# Patient Record
Sex: Female | Born: 1965 | Race: White | Hispanic: No | Marital: Married | Smoking: Never smoker
Health system: Southern US, Community
[De-identification: ages and names within clinical notes are randomized; demographics above are authoritative.]

## PROBLEM LIST (undated history)

## (undated) HISTORY — PX: TUBAL LIGATION: SHX77

---

## 2004-05-31 ENCOUNTER — Emergency Department (HOSPITAL_COMMUNITY): Admission: EM | Admit: 2004-05-31 | Discharge: 2004-06-01 | Payer: Self-pay

## 2004-06-12 ENCOUNTER — Ambulatory Visit (HOSPITAL_COMMUNITY): Admission: RE | Admit: 2004-06-12 | Discharge: 2004-06-12 | Payer: Self-pay

## 2005-04-01 ENCOUNTER — Other Ambulatory Visit: Admission: RE | Admit: 2005-04-01 | Discharge: 2005-04-01 | Payer: Self-pay | Admitting: Obstetrics and Gynecology

## 2006-03-26 ENCOUNTER — Other Ambulatory Visit: Admission: RE | Admit: 2006-03-26 | Discharge: 2006-03-26 | Payer: Self-pay | Admitting: Obstetrics & Gynecology

## 2006-03-31 ENCOUNTER — Ambulatory Visit (HOSPITAL_COMMUNITY): Admission: RE | Admit: 2006-03-31 | Discharge: 2006-03-31 | Payer: Self-pay | Admitting: Obstetrics and Gynecology

## 2007-01-31 IMAGING — US US TRANSVAGINAL NON-OB
1 series · 18 of 25 positions shown · non-contrast
Comparison: none

CLINICAL DATA: Family history of ovarian cancer.
 TRANSABDOMINAL AND TRANSVAGINAL PELVIC ULTRASOUND ? 03/31/06:
TECHNIQUE: Both transabdominal and transvaginal ultrasound examinations of the pelvis were performed including evaluation of the uterus, ovaries, adnexal regions, and pelvic cul-de-sac.

[Series 1: us pelvis complete modify · 18 of 41 slices shown]
[im 1/41]
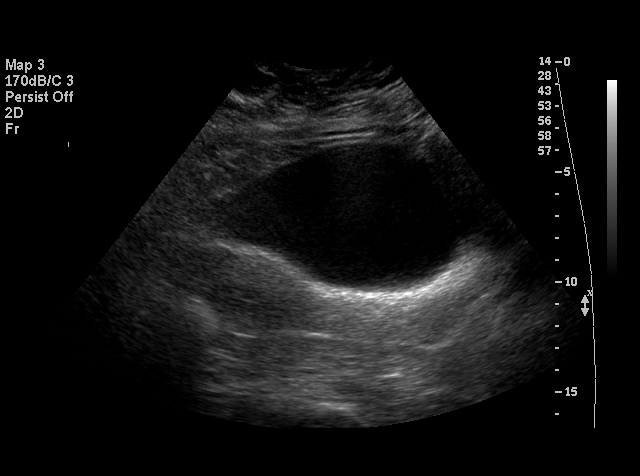
[im 4/41]
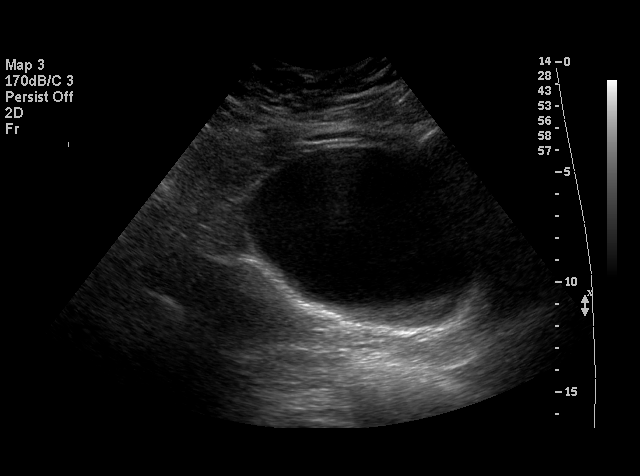
[im 6/41]
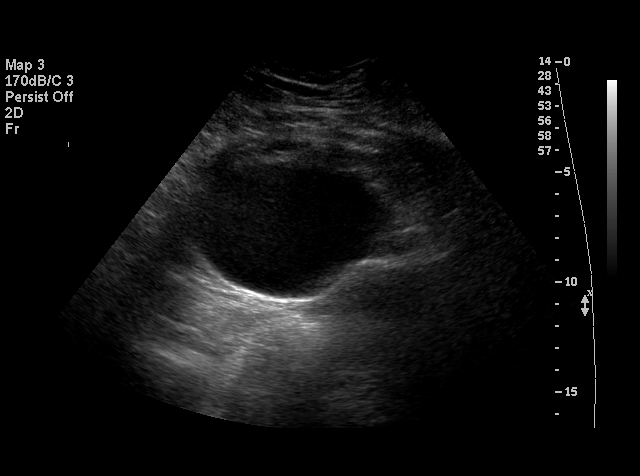
[im 7/41]
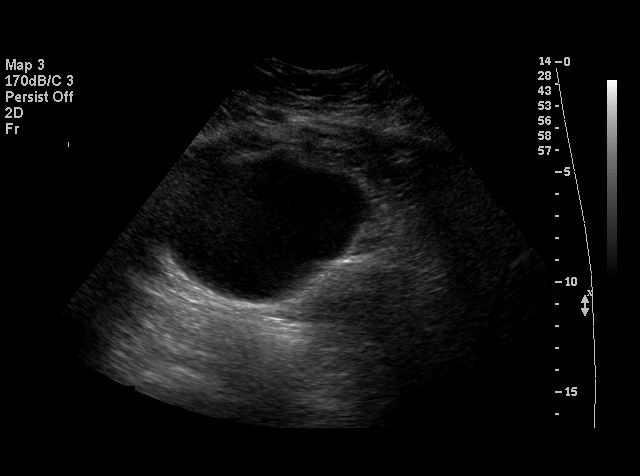
[im 11/41]
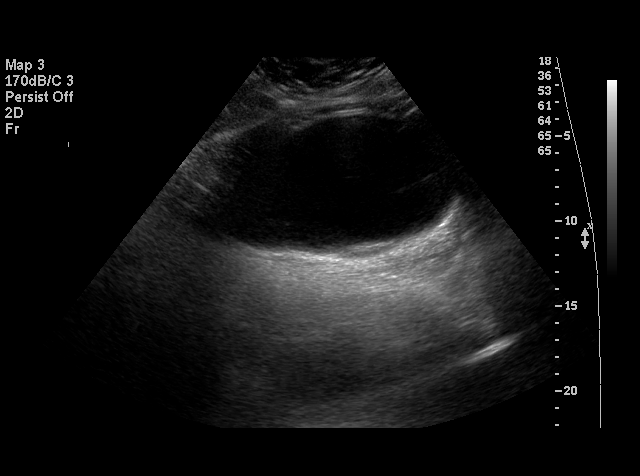
[im 12/41]
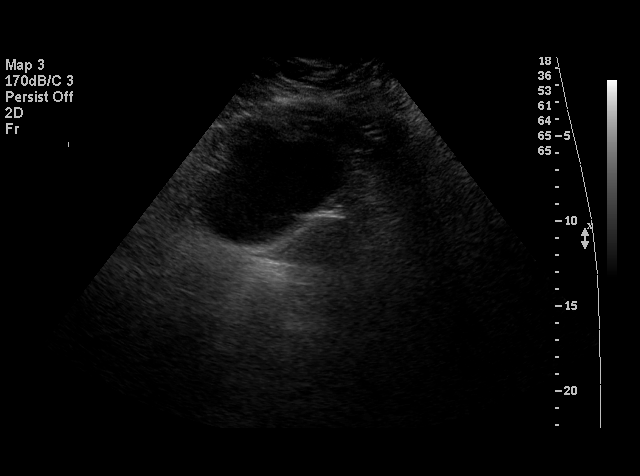
[im 16/41]
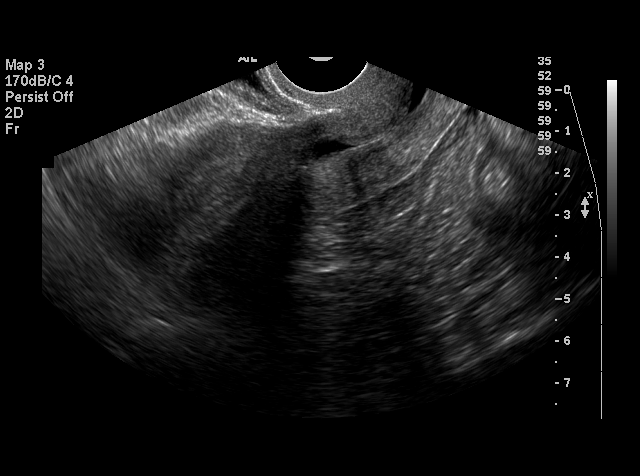
[im 17/41]
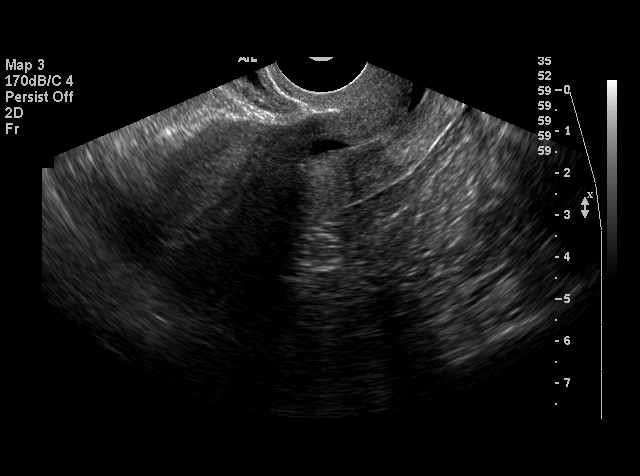
[im 19/41]
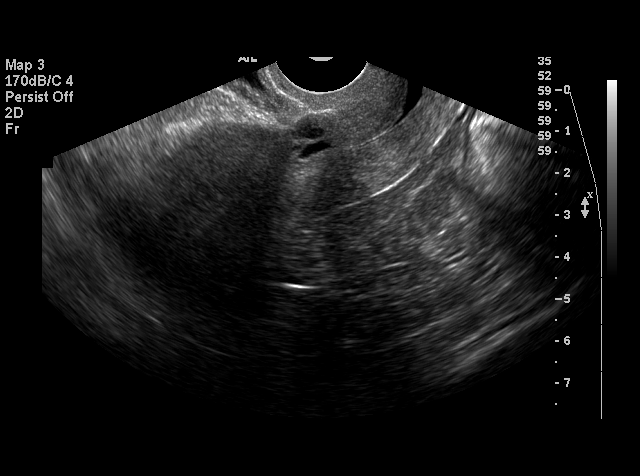
[im 22/41]
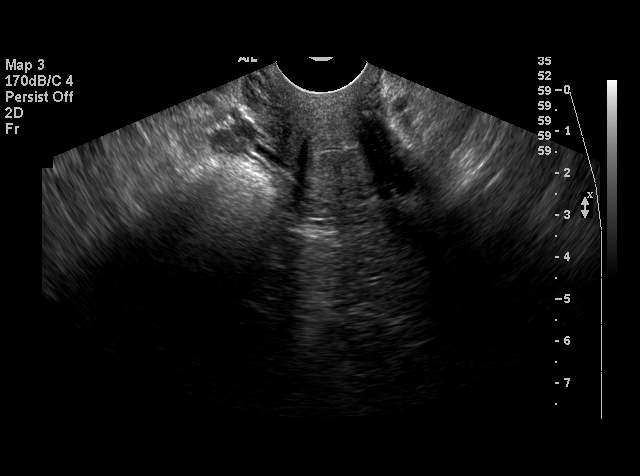
[im 24/41]
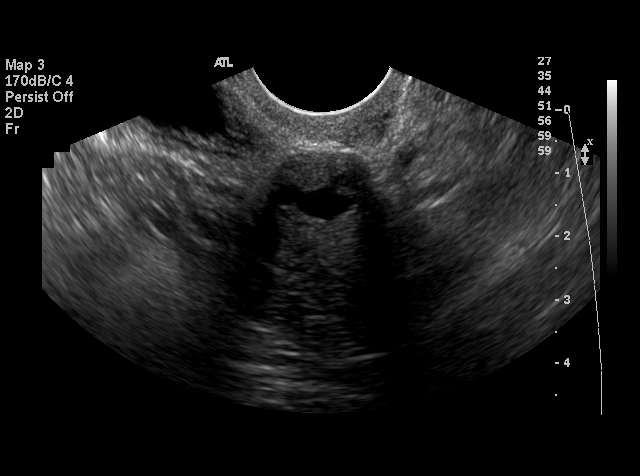
[im 26/41]
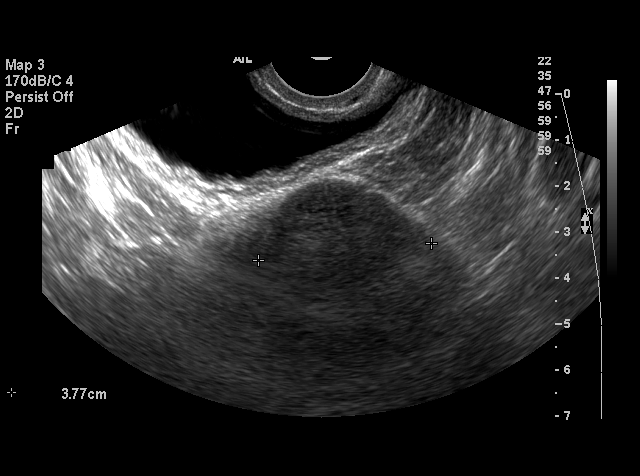
[im 29/41]
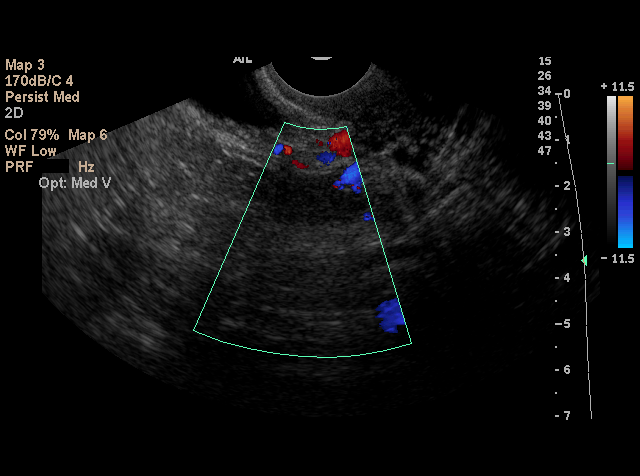
[im 31/41]
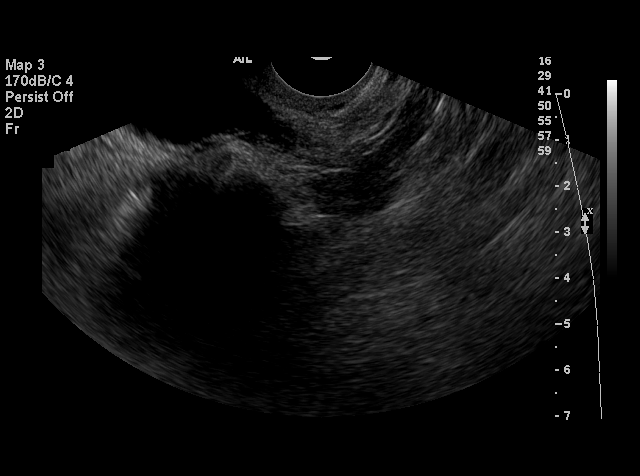
[im 34/41]
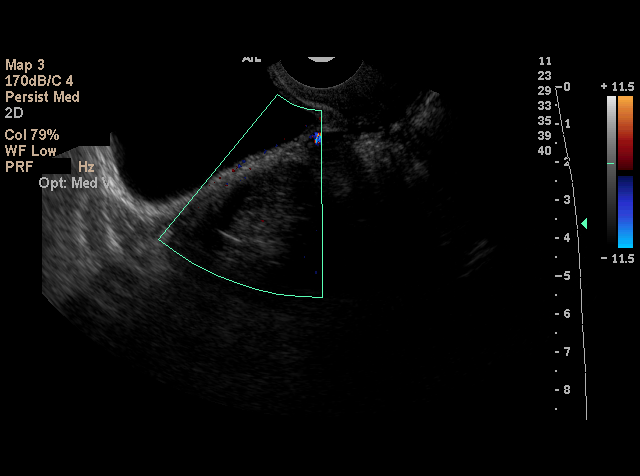
[im 36/41]
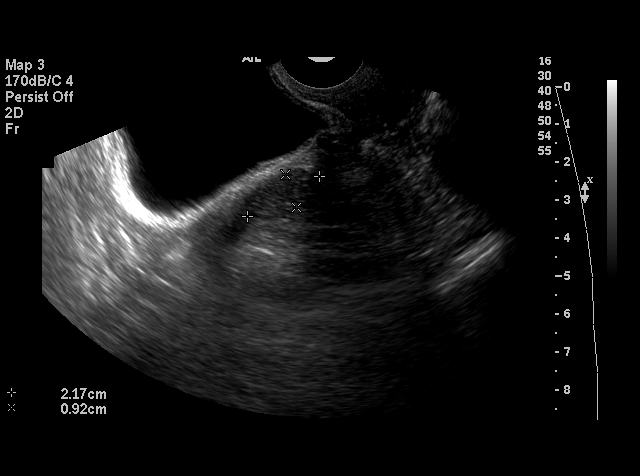
[im 37/41]
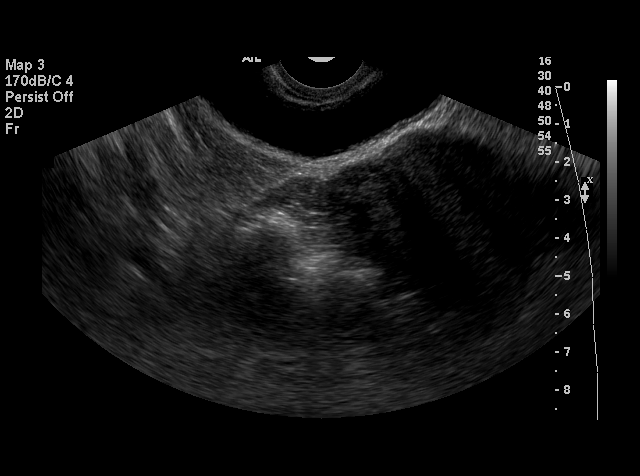
[im 41/41]
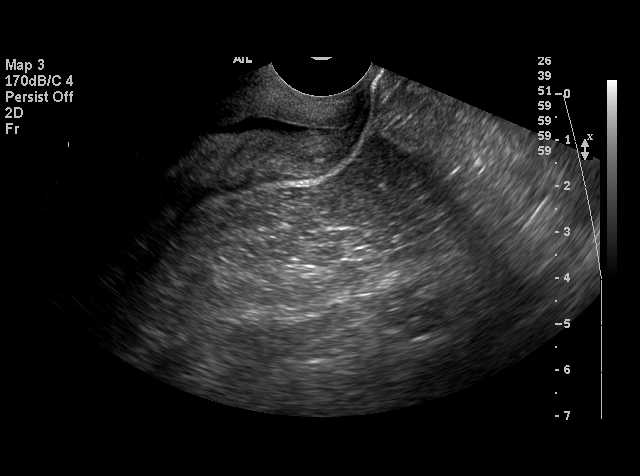

[18 of 25 positions shown; findings below may reference images not displayed]

FINDINGS: The uterus has a normal size, shape, and echotexture.  The uterine dimensions are 11.8 x 3.0 x 3.7 cm.  The endometrium is thin and homogeneous, measuring 5 mm in width.  A small amount of simple fluid is incidentally seen within the endometrial cavity.  
 Both ovaries have a normal size, shape, and echotexture.  The right ovary measures 2.2 x 1.0 x 1.4 cm, and the left ovary measures 2.2 x 1.1 x 1.6 cm.  No adnexal masses or free pelvic fluid are seen.
IMPRESSION: Normal pelvic ultrasound.

## 2013-09-27 ENCOUNTER — Emergency Department (HOSPITAL_COMMUNITY): Payer: Self-pay

## 2013-09-27 ENCOUNTER — Emergency Department (HOSPITAL_COMMUNITY)
Admission: EM | Admit: 2013-09-27 | Discharge: 2013-09-28 | Disposition: A | Discharge status: Home / Self Care | Payer: Self-pay | Attending: Emergency Medicine | Admitting: Emergency Medicine

## 2013-09-27 ENCOUNTER — Encounter (HOSPITAL_COMMUNITY): Payer: Self-pay | Admitting: Emergency Medicine

## 2013-09-27 DIAGNOSIS — R296 Repeated falls: Secondary | ICD-10-CM | POA: Insufficient documentation

## 2013-09-27 DIAGNOSIS — Z79899 Other long term (current) drug therapy: Secondary | ICD-10-CM | POA: Insufficient documentation

## 2013-09-27 DIAGNOSIS — M25571 Pain in right ankle and joints of right foot: Secondary | ICD-10-CM

## 2013-09-27 DIAGNOSIS — Y9389 Activity, other specified: Secondary | ICD-10-CM | POA: Insufficient documentation

## 2013-09-27 DIAGNOSIS — Y92009 Unspecified place in unspecified non-institutional (private) residence as the place of occurrence of the external cause: Secondary | ICD-10-CM | POA: Insufficient documentation

## 2013-09-27 DIAGNOSIS — S82839A Other fracture of upper and lower end of unspecified fibula, initial encounter for closed fracture: Secondary | ICD-10-CM

## 2013-09-27 DIAGNOSIS — S8253XA Displaced fracture of medial malleolus of unspecified tibia, initial encounter for closed fracture: Secondary | ICD-10-CM

## 2013-09-27 DIAGNOSIS — S82899A Other fracture of unspecified lower leg, initial encounter for closed fracture: Secondary | ICD-10-CM | POA: Insufficient documentation

## 2013-09-27 MED ORDER — OXYCODONE-ACETAMINOPHEN 5-325 MG PO TABS
1.0000 | ORAL_TABLET | Freq: Once | ORAL | Status: AC
  Administered 2013-09-27: 1 via ORAL
  Filled 2013-09-27: qty 1

## 2013-09-27 NOTE — ED Provider Notes (Signed)
CSN: [id number]     Arrival date & time 09/27/13  2228 History   First MD Initiated Contact with Patient 09/27/13 2310  This chart was scribed for non-physician practitioner Antony MaduraKelly Khyli Swaim, PA-C working with Candyce ChurnJohn David Wofford, MD by Valera CastleSteven Perry, ED scribe. This patient was seen in room TR09C/TR09C and the patient's care was started at 11:51 PM.     Chief Complaint  Patient presents with  . Ankle Injury    Patient is a 48 y.o. female presenting with ankle pain. The history is provided by the patient. No language interpreter was used.  Ankle Pain Location:  Ankle Time since incident: earlier this evening. Ankle location:  R ankle Pain details:    Quality:  Aching and throbbing   Radiates to:  Does not radiate   Duration: earlier this evening.   Timing:  Constant   Progression:  Unchanged Chronicity:  New Dislocation: no   Prior injury to area:  No Worsened by:  Bearing weight Associated symptoms: no numbness and no tingling    HPI Comments: Marissa Wallace is a 48 y.o. female who presents to the Emergency Department complaining of constant, throbbing, aching, right ankle pain, with a severity of 9/10 initially, 7/10 after ED pain medication, onset when she fell earlier this evening after stepping on a toy at home. She reports that her ankle bent underneath her leg. She reports associated swelling to the area. She denies LOC, head trauma, and any other associated symptoms.   PCP - No primary provider on file.  History reviewed. No pertinent past medical history. Past Surgical History  Procedure Laterality Date  . Cesarean section    . Tubal ligation     No family history on file. History  Substance Use Topics  . Smoking status: Never Smoker   . Smokeless tobacco: Not on file  . Alcohol Use: No   OB History   Grav Para Term Preterm Abortions TAB SAB Ect Mult Living                 Review of Systems  Musculoskeletal: Positive for arthralgias (right ankle) and joint swelling  (right ankile).  Neurological: Negative for syncope and headaches.    Allergies  Bactrim; Bentyl; Codeine; and Phenobarbital  Home Medications   Current Outpatient Rx  Name  Route  Sig  Dispense  Refill  . omeprazole (PRILOSEC) 20 MG capsule   Oral   Take 20 mg by mouth daily.         Marland Kitchen. PARoxetine (PAXIL) 20 MG tablet   Oral   Take 20 mg by mouth daily.         Marland Kitchen. ibuprofen (ADVIL,MOTRIN) 800 MG tablet   Oral   Take 1 tablet (800 mg total) by mouth 3 (three) times daily.   21 tablet   0   . oxyCODONE-acetaminophen (PERCOCET/ROXICET) 5-325 MG per tablet   Oral   Take 1-2 tablets by mouth every 6 (six) hours as needed for severe pain.   17 tablet   0    BP 149/79  Pulse 101  Temp(Src) 98.8 F (37.1 C) (Oral)  Resp 22  SpO2 100%  LMP 09/19/2013  Physical Exam  Nursing note and vitals reviewed. Constitutional: She is oriented to person, place, and time. She appears well-developed and well-nourished. No distress.  HENT:  Head: Normocephalic and atraumatic.  Mouth/Throat: Oropharynx is clear and moist.  Eyes: Conjunctivae and EOM are normal.  Neck: Normal range of motion. Neck supple.  Cardiovascular:  Normal rate, regular rhythm and intact distal pulses.   DP and PT pulses 2+ in right LE. Cap refills are normal.  Pulmonary/Chest: Effort normal. No respiratory distress.  Musculoskeletal: She exhibits edema and tenderness.       Right ankle: She exhibits decreased range of motion and swelling. She exhibits no deformity, no laceration and normal pulse. Tenderness. Lateral malleolus and medial malleolus tenderness found. Achilles tendon normal.  Tenderness to lateral malleolus, with surrounding edema.  Neurological: She is alert and oriented to person, place, and time. No sensory deficit.  No sensory deficits appreciated. Patellar and achilles reflexes are 1+ secondary to pain.  Skin: Skin is warm and dry. No rash noted. No erythema. No pallor.  Psychiatric: She has  a normal mood and affect. Her behavior is normal.    ED Course  Procedures (including critical care time)  DIAGNOSTIC STUDIES: Oxygen Saturation is 100% on room air, normal by my interpretation.    COORDINATION OF CARE: 11:54 PM-Discussed treatment plan which includes imaging resulting in fx with pt at bedside and pt agreed to plan. Will refer to orthopedist.   Labs Review Labs Reviewed - No data to display Imaging Review Dg Ankle Complete Right  09/27/2013   CLINICAL DATA:  Status post fall; twisting injury to right ankle, with pain.  EXAM: RIGHT ANKLE - COMPLETE 3+ VIEW  COMPARISON:  None.  FINDINGS: A mildly displaced oblique fracture is seen across the distal fibula, with mild comminution. There also appears to be a small avulsion fracture at the distal tip of the medial malleolus. The ankle mortise is grossly unremarkable, without evidence of talar tilt or subluxation at this time. The location of the fracture could cause ankle mortise widening. The interosseous space is within normal limits.  The joint spaces are preserved. Lateral soft tissue swelling is noted.  IMPRESSION: 1. Mildly displaced oblique fracture across the distal fibula, with mild comminution. 2. Small avulsion fracture at the distal tip of the medial malleolus.   Electronically Signed   By: Roanna Raider M.D.   On: 09/27/2013 23:27    EKG Interpretation   None       MDM   1. Fracture of distal fibula   2. Avulsion fracture of medial malleolus   3. Ankle pain, right    Uncomplicated closed R ankle fracture. Patient well and nontoxic appearing, hemodynamically stable, and afebrile. She is neurovascularly intact. No evidence of pallor, pulselessness, poikilothermia, or paresthesias. Xray confirms ankle fracture. Patient placed in posterior stirrup splint and given crutches. Percocet improving pain over ED course. She is stable for d/c with orthopedic f/u for further evaluation of symptoms. RICE protocol. Return  precautions discussed and patient agreeable to plan with no unaddressed concerns.   I personally performed the services described in this documentation, which was scribed in my presence. The recorded information has been reviewed and is accurate.     Antony Madura, PA-C 09/28/13 0020

## 2013-09-27 NOTE — ED Notes (Signed)
Ice pack and elevation applied to right ankle. 

## 2013-09-27 NOTE — ED Notes (Signed)
Pt. accidentally stepped on a toy at home and twisted her right ankle this evening , presents with pain and swelling at right ankle.

## 2013-09-28 MED ORDER — OXYCODONE-ACETAMINOPHEN 5-325 MG PO TABS: 1.0000 | ORAL_TABLET | Freq: Four times a day (QID) | ORAL | Status: AC | PRN

## 2013-09-28 MED ORDER — IBUPROFEN 800 MG PO TABS: 800.0000 mg | ORAL_TABLET | Freq: Three times a day (TID) | ORAL | Status: AC

## 2013-09-28 MED ORDER — OXYCODONE-ACETAMINOPHEN 5-325 MG PO TABS
1.0000 | ORAL_TABLET | Freq: Once | ORAL | Status: AC
  Administered 2013-09-28: 1 via ORAL
  Filled 2013-09-28: qty 1

## 2013-09-28 NOTE — Discharge Instructions (Signed)
Follow up with an orthopedist regarding your fracture. Use crutches when walking. Return if symptoms worsen.  RICE: Routine Care for Injuries The routine care of many injuries includes Rest, Ice, Compression, and Elevation (RICE). HOME CARE INSTRUCTIONS  Rest is needed to allow your body to heal. Routine activities can usually be resumed when comfortable. Injured tendons and bones can take up to 6 weeks to heal. Tendons are the cord-like structures that attach muscle to bone.  Ice following an injury helps keep the swelling down and reduces pain.  Put ice in a plastic bag.  Place a towel between your skin and the bag.  Leave the ice on for 15-20 minutes, 03-04 times a day. Do this while awake, for the first 24 to 48 hours. After that, continue as directed by your caregiver.  Compression helps keep swelling down. It also gives support and helps with discomfort. If an elastic bandage has been applied, it should be removed and reapplied every 3 to 4 hours. It should not be applied tightly, but firmly enough to keep swelling down. Watch fingers or toes for swelling, bluish discoloration, coldness, numbness, or excessive pain. If any of these problems occur, remove the bandage and reapply loosely. Contact your caregiver if these problems continue.  Elevation helps reduce swelling and decreases pain. With extremities, such as the arms, hands, legs, and feet, the injured area should be placed near or above the level of the heart, if possible. SEEK IMMEDIATE MEDICAL CARE IF:  You have persistent pain and swelling.  You develop redness, numbness, or unexpected weakness.  Your symptoms are getting worse rather than improving after several days. These symptoms may indicate that further evaluation or further X-rays are needed. Sometimes, X-rays may not show a small broken bone (fracture) until 1 week or 10 days later. Make a follow-up appointment with your caregiver. Ask when your X-ray results will be  ready. Make sure you get your X-ray results. Document Released: 12/07/2000 Document Revised: 11/17/2011 Document Reviewed: 01/24/2011 [facility] Patient Information 2014 [place], [place]. Cast or Splint Care Casts and splints support injured limbs and keep bones from moving while they heal. It is important to care for your cast or splint at home.  HOME CARE INSTRUCTIONS  Keep the cast or splint uncovered during the drying period. It can take 24 to 48 hours to dry if it is made of plaster. A fiberglass cast will dry in less than 1 hour.  Do not rest the cast on anything harder than a pillow for the first 24 hours.  Do not put weight on your injured limb or apply pressure to the cast until your health care provider gives you permission.  Keep the cast or splint dry. Wet casts or splints can lose their shape and may not support the limb as well. A wet cast that has lost its shape can also create harmful pressure on your skin when it dries. Also, wet skin can become infected.  Cover the cast or splint with a plastic bag when bathing or when out in the rain or snow. If the cast is on the trunk of the body, take sponge baths until the cast is removed.  If your cast does become wet, dry it with a towel or a blow dryer on the cool setting only.  Keep your cast or splint clean. Soiled casts may be wiped with a moistened cloth.  Do not place any hard or soft foreign objects under your cast or splint, such as cotton,  toilet paper, lotion, or powder.  Do not try to scratch the skin under the cast with any object. The object could get stuck inside the cast. Also, scratching could lead to an infection. If itching is a problem, use a blow dryer on a cool setting to relieve discomfort.  Do not trim or cut your cast or remove padding from inside of it.  Exercise all joints next to the injury that are not immobilized by the cast or splint. For example, if you have a long leg cast, exercise the hip joint and  toes. If you have an arm cast or splint, exercise the shoulder, elbow, thumb, and fingers.  Elevate your injured arm or leg on 1 or 2 pillows for the first 1 to 3 days to decrease swelling and pain.It is best if you can comfortably elevate your cast so it is higher than your heart. SEEK MEDICAL CARE IF:   Your cast or splint cracks.  Your cast or splint is too tight or too loose.  You have unbearable itching inside the cast.  Your cast becomes wet or develops a soft spot or area.  You have a bad smell coming from inside your cast.  You get an object stuck under your cast.  Your skin around the cast becomes red or raw.  You have new pain or worsening pain after the cast has been applied. SEEK IMMEDIATE MEDICAL CARE IF:   You have fluid leaking through the cast.  You are unable to move your fingers or toes.  You have discolored (blue or white), cool, painful, or very swollen fingers or toes beyond the cast.  You have tingling or numbness around the injured area.  You have severe pain or pressure under the cast.  You have any difficulty with your breathing or have shortness of breath.  You have chest pain. Document Released: 08/22/2000 Document Revised: 06/15/2013 Document Reviewed: 03/03/2013 [facility] Patient Information 2014 [place], [place].

## 2013-09-30 NOTE — ED Provider Notes (Signed)
Medical screening examination/treatment/procedure(s) were performed by non-physician practitioner and as supervising physician I was immediately available for consultation/collaboration.  EKG Interpretation   None         Marissa ChurnJohn David Joquan Lotz, MD 09/30/13 [phone]

## 2014-07-30 IMAGING — CR DG ANKLE COMPLETE 3+V*R*
3 series · 3 of 3 positions shown · non-contrast
Comparison: None.

CLINICAL DATA: Status post fall; twisting injury to right ankle,
with pain.

EXAM:
RIGHT ANKLE - COMPLETE 3+ VIEW

[t ankle joint ap right]
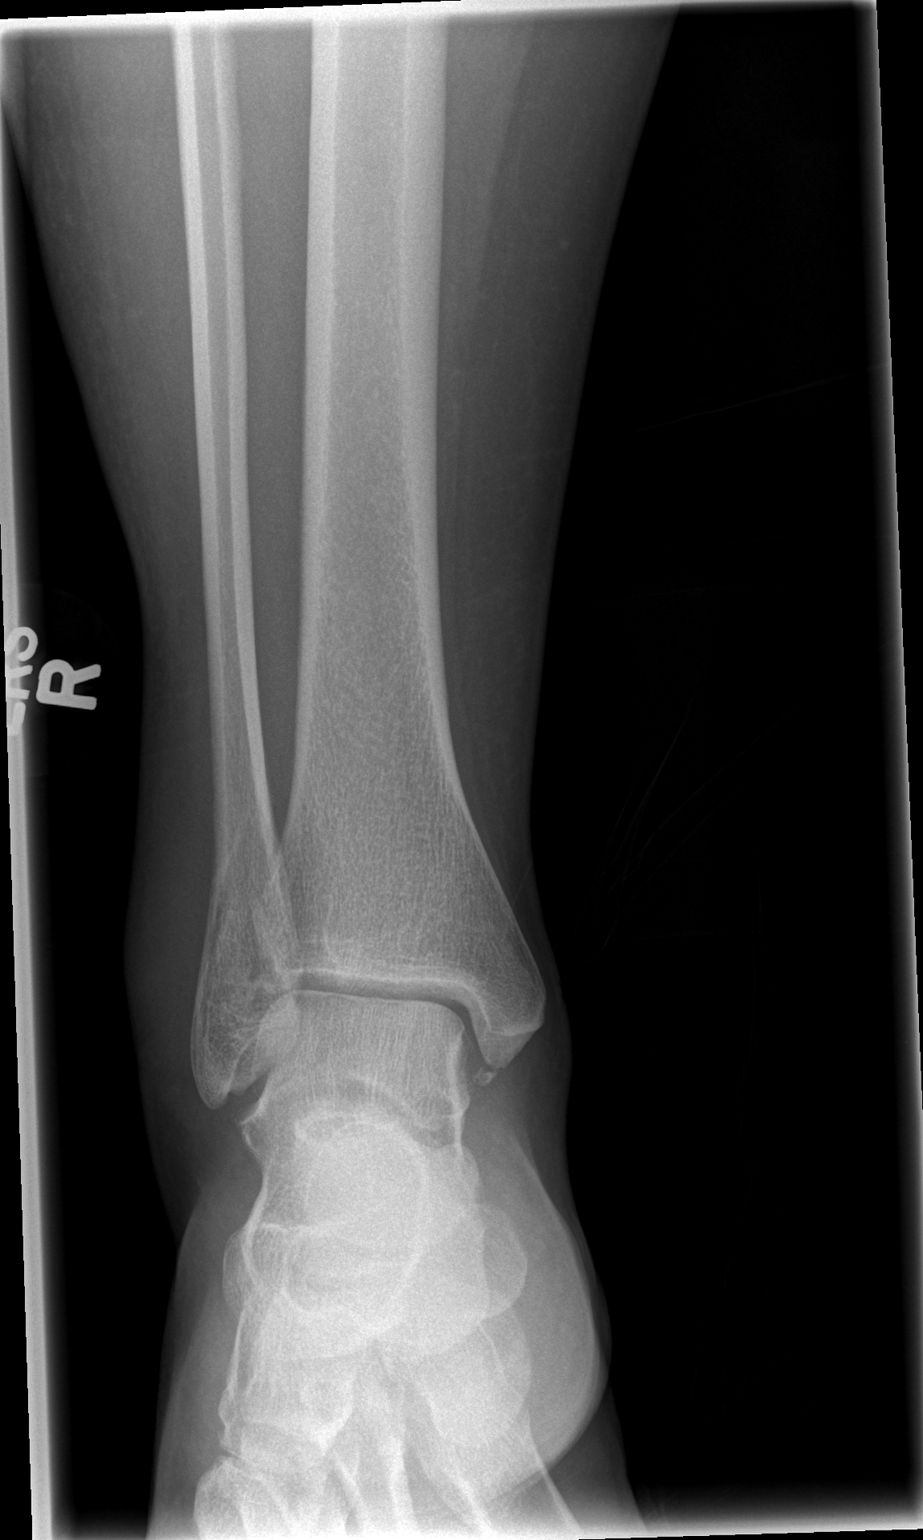

[t ankle joint oblique right]
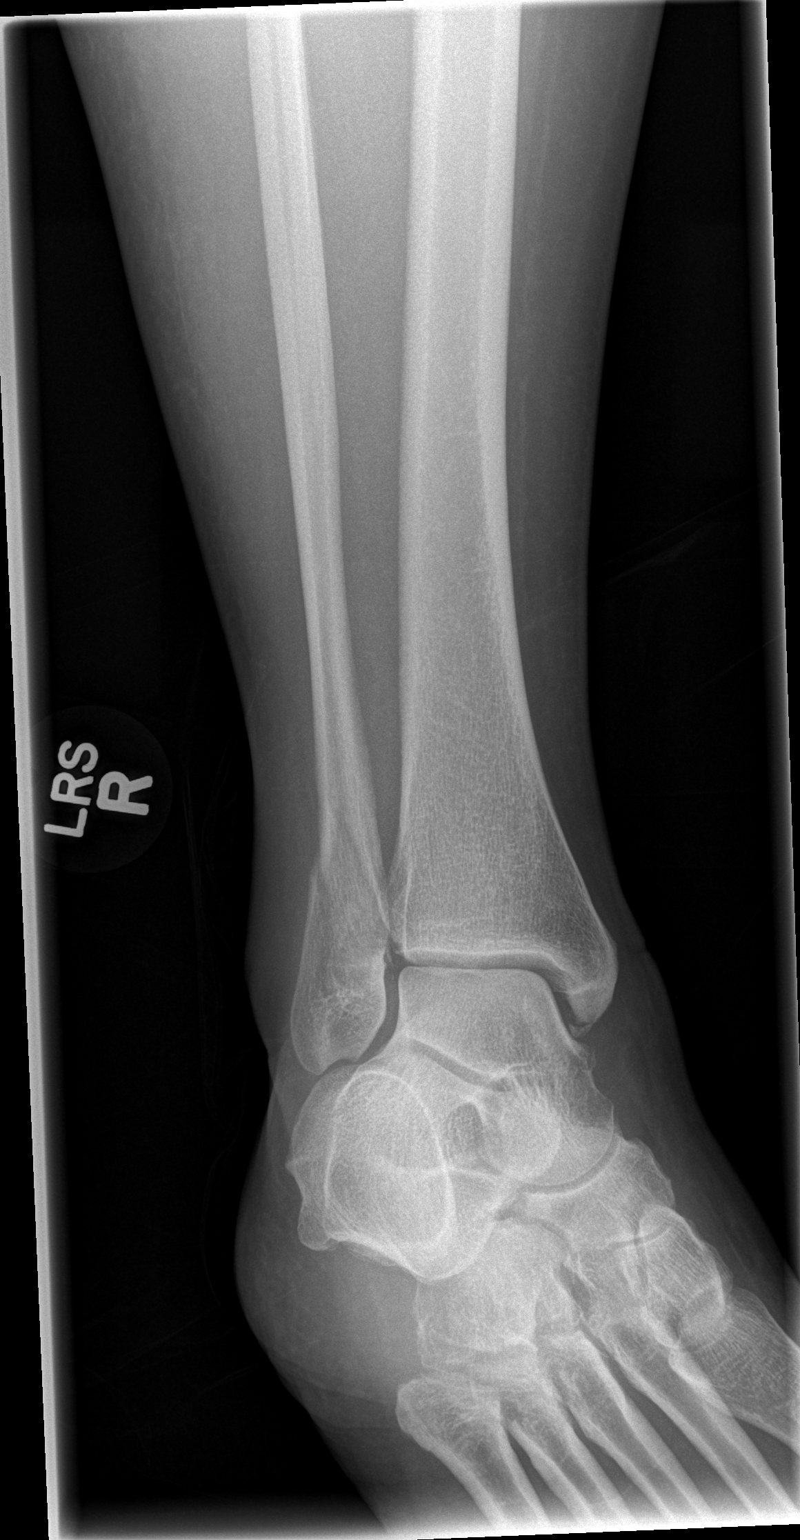

[t ankle joint lat right]
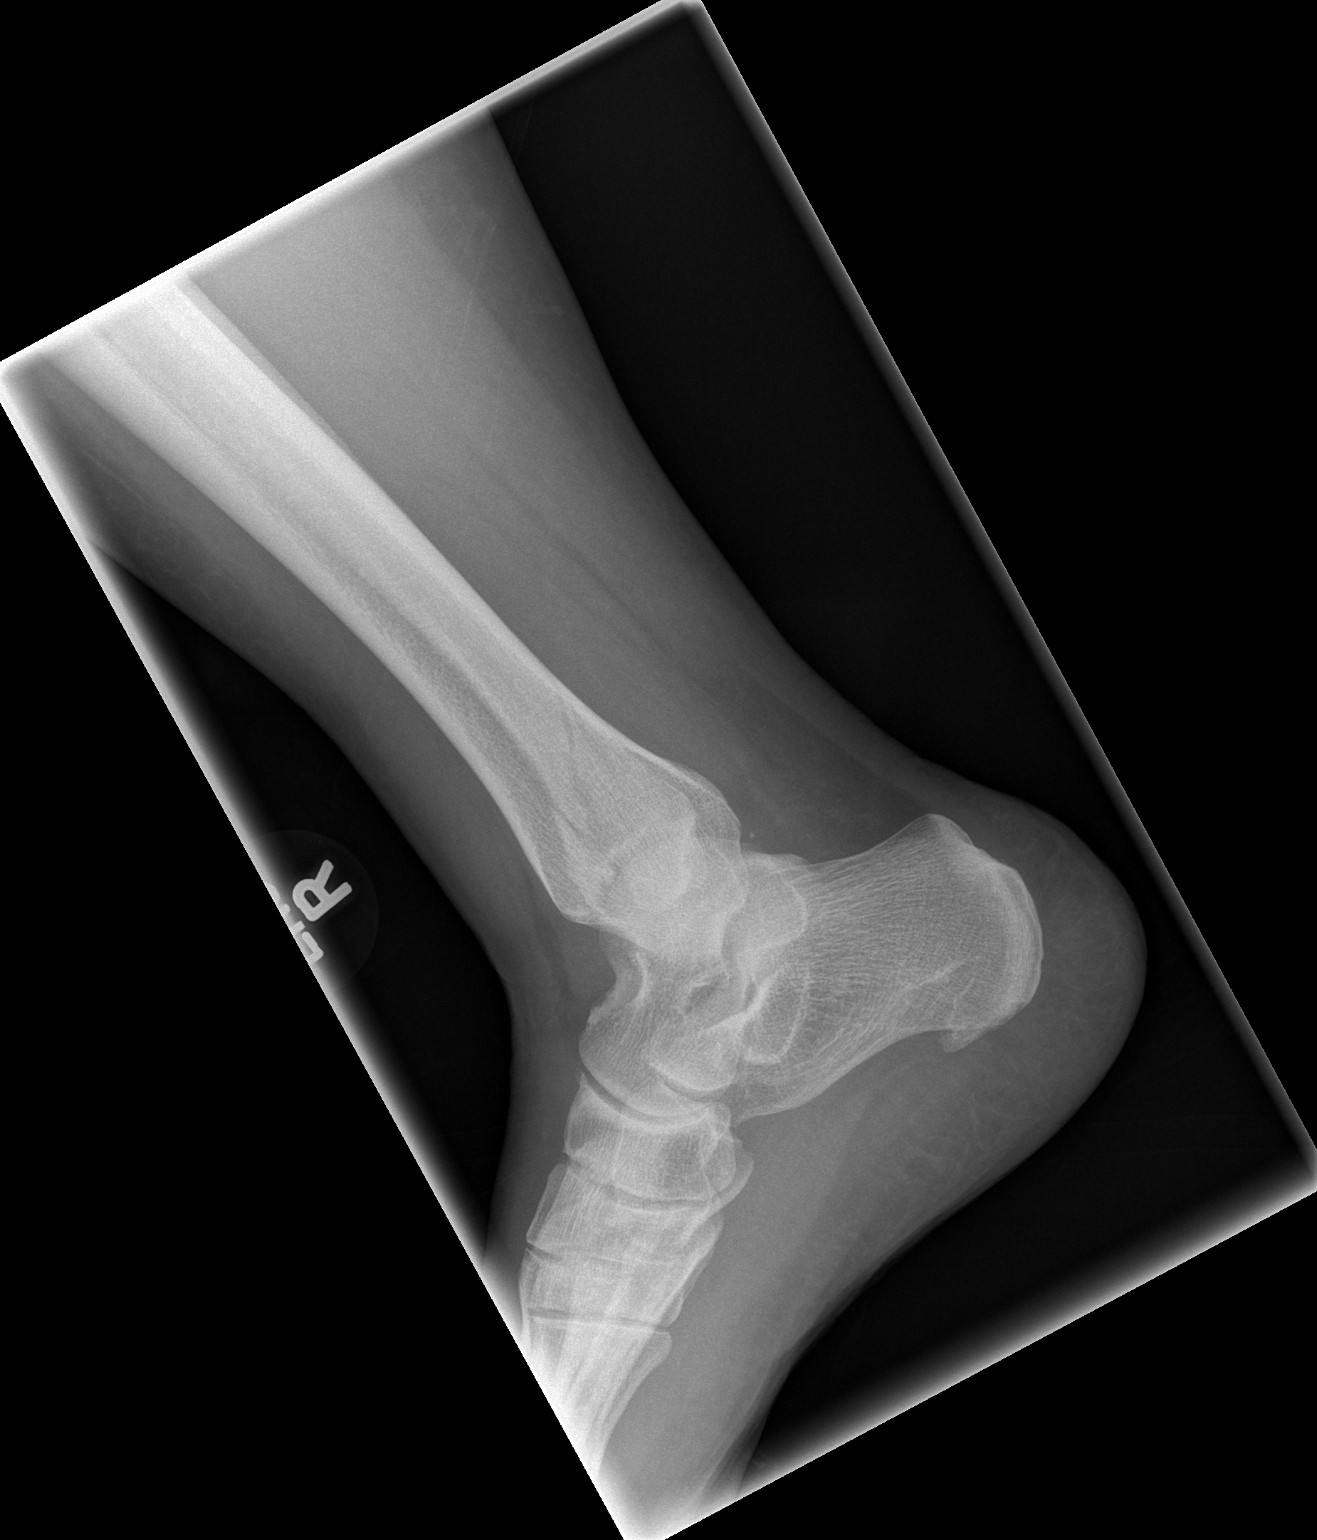

[3 of 3 positions shown; findings below may reference images not displayed]

FINDINGS: A mildly displaced oblique fracture is seen across the distal
fibula, with mild comminution. There also appears to be a small
avulsion fracture at the distal tip of the medial malleolus. The
ankle mortise is grossly unremarkable, without evidence of talar
tilt or subluxation at this time. The location of the fracture could
cause ankle mortise widening. The interosseous space is within
normal limits.

The joint spaces are preserved. Lateral soft tissue swelling is
noted.
IMPRESSION: 1. Mildly displaced oblique fracture across the distal fibula, with
mild comminution.
2. Small avulsion fracture at the distal tip of the medial
malleolus.
